# Patient Record
Sex: Male | Born: 1975 | Race: White | Hispanic: No | Marital: Married | Smoking: Never smoker
Health system: Southern US, Community
[De-identification: ages and names within clinical notes are randomized; demographics above are authoritative.]

## PROBLEM LIST (undated history)

## (undated) HISTORY — PX: NO PAST SURGERIES: SHX2092

---

## 2018-08-10 ENCOUNTER — Other Ambulatory Visit: Payer: Self-pay

## 2018-08-10 ENCOUNTER — Ambulatory Visit
Admission: EM | Admit: 2018-08-10 | Discharge: 2018-08-10 | Disposition: A | Discharge status: Home / Self Care | Payer: Self-pay | Attending: Emergency Medicine | Admitting: Emergency Medicine

## 2018-08-10 ENCOUNTER — Ambulatory Visit (INDEPENDENT_AMBULATORY_CARE_PROVIDER_SITE_OTHER): Payer: Self-pay

## 2018-08-10 DIAGNOSIS — M79671 Pain in right foot: Secondary | ICD-10-CM

## 2018-08-10 DIAGNOSIS — S92214A Nondisplaced fracture of cuboid bone of right foot, initial encounter for closed fracture: Secondary | ICD-10-CM

## 2018-08-10 DIAGNOSIS — M25571 Pain in right ankle and joints of right foot: Secondary | ICD-10-CM

## 2018-08-10 MED ORDER — TRAMADOL HCL 50 MG PO TABS: 50.0000 mg | ORAL_TABLET | Freq: Three times a day (TID) | ORAL | 0 refills | Status: AC | PRN

## 2018-08-10 NOTE — Discharge Instructions (Signed)
Take medication as prescribed.  Over-the-counter ibuprofen as needed.  Rest. Drink plenty of fluids.  Keep in splint and use crutches.  Ice and elevate.  Follow-up with podiatry at the beginning of this coming week.  See above to call tomorrow to schedule.  Follow up with your primary care physician this week as needed. Return to Urgent care for new or worsening concerns.

## 2018-08-10 NOTE — ED Provider Notes (Addendum)
[facility] ____________________________________________  Time seen: Approximately 6:18 PM  I have reviewed the triage vital signs and the nursing notes.   HISTORY  Chief Complaint Fall and Ankle Pain   HPI Richard Cowan is a 43 y.o. male presenting for evaluation of right foot and right ankle pain after injury that occurred around 1130 this morning.  Patient states when he got home from work he stepped into the shower, and states it was slippery causing him to roll his ankle and fall.  Denies any other injuries from the fall.  Denies head injury or loss of consciousness.  States pain is all to the right foot and ankle.  Has been able to apply weight but painful in doing so.  Did apply ice earlier.  Denies other alleviating measures attempted.  Pain currently moderate.  Denies previous injuries to right foot or ankle. Reports fracture to right tibia as young child. Denies recent cough, congestion, chest pain, shortness of breath, sore throat or fevers.  Denies other complaints.   History reviewed. No pertinent past medical history.  There are no active problems to display for this patient.   Past Surgical History:  Procedure Laterality Date  . NO PAST SURGERIES       No current facility-administered medications for this encounter.   Current Outpatient Medications:  .  traMADol (ULTRAM) 50 MG tablet, Take 1 tablet (50 mg total) by mouth every 8 (eight) hours as needed for moderate pain or severe pain., Disp: 15 tablet, Rfl: 0  Allergies Patient has no known allergies.  History reviewed. No pertinent family history.  Social History Social History   Tobacco Use  . Smoking status: Never Smoker  . Smokeless tobacco: Never Used  Substance Use Topics  . Alcohol use: Yes    Comment: occasionally  . Drug use: Not Currently    Review of Systems Constitutional: No fever Cardiovascular: Denies chest pain. Respiratory: Denies shortness of breath.  Gastrointestinal: No abdominal pain.   Musculoskeletal: Positive right foot and right ankle pain.  Negative for back pain. Skin: Negative for rash. Neurological: Negative for headaches, focal weakness or numbness.   ____________________________________________   PHYSICAL EXAM:  VITAL SIGNS: ED Triage Vitals  Enc Vitals Group     BP 08/10/18 1752 118/80     Pulse Rate 08/10/18 1752 86     Resp 08/10/18 1752 18     Temp 08/10/18 1752 97.8 F (36.6 C)     Temp Source 08/10/18 1752 Oral     SpO2 08/10/18 1752 100 %     Weight 08/10/18 1751 280 lb (127 kg)     Height 08/10/18 1751 6' (1.829 m)     Head Circumference --      Peak Flow --      Pain Score 08/10/18 1751 8     Pain Loc --      Pain Edu? --      Excl. in [place]? --     Constitutional: Alert and oriented. Well appearing and in no acute distress. ENT      Head: Normocephalic and atraumatic. Cardiovascular: Normal rate, regular rhythm. Grossly normal heart sounds.  Good peripheral circulation. Respiratory: Normal respiratory effort without tachypnea nor retractions. Breath sounds are clear and equal bilaterally. No wheezes, rales, rhonchi. Musculoskeletal: Ambulatory with antalgic gait.  Bilateral pedal pulses equal and easily palpated.   Except: Right lateral malleolus and mid to lateral right foot moderate tenderness to direct palpation with mild to moderate localized edema and  ecchymosis, normal distal sensation and capillary refill, medial foot and ankle nontender, good dorsiflexion and plantar flexion, limited ankle rotation, right lower extremity otherwise nontender.  Right tibia nontender. Neurologic:  Normal speech and language.  Skin:  Skin is warm, dry and intact. No rash noted. Psychiatric: Mood and affect are normal. Speech and behavior are normal. Patient exhibits appropriate insight and judgment   ___________________________________________   LABS (all labs ordered are listed, but only abnormal results are  displayed)  Labs Reviewed - No data to display ____________________________________________  RADIOLOGY  Dg Ankle Complete Right  Result Date: 08/10/2018 CLINICAL DATA:  Larey SeatFell getting in tub today and injured right foot and ankle. Most pain swelling/bruising over top of foot near cuboid area and lat malleolus EXAM: RIGHT ANKLE - COMPLETE 3+ VIEW COMPARISON:  None. FINDINGS: There is moderate soft tissue swelling at the ankle come particularly along the LATERAL aspect. There is no acute fracture or subluxation. Small Achilles spur is present. Deformity of the mid to distal RIGHT tibia favors remote fracture. IMPRESSION: 1. Soft tissue swelling. 2. No evidence for acute osseous abnormality. Electronically Signed   By: Norva PavlovElizabeth  Brown M.D.   On: 08/10/2018 18:19   Dg Foot Complete Right  Result Date: 08/10/2018 CLINICAL DATA:  Larey SeatFell getting in tub today and injured right foot and ankle. Most pain swelling/bruising over top of foot near cuboid area and lat malleolus EXAM: RIGHT FOOT COMPLETE - 3+ VIEW COMPARISON:  None. FINDINGS: There is soft tissue swelling along the LATERAL aspect of the midfoot in the dorsum of the forefoot. There is no acute fracture or subluxation. Small Achilles spur is present. IMPRESSION: 1. Soft tissue swelling. 2. No evidence for acute osseous abnormality. Electronically Signed   By: Norva PavlovElizabeth  Brown M.D.   On: 08/10/2018 18:18   ____________________________________________   PROCEDURES Procedures     INITIAL IMPRESSION / ASSESSMENT AND PLAN / ED COURSE  Pertinent labs & imaging results that were available during my care of the patient were reviewed by me and considered in my medical decision making (see chart for details).  Room patient.  No acute distress.  Right foot and right ankle pain post mechanical injury that occurred this morning.  Denies other complaints.  Right foot and right ankle x-rays as above.  Right ankle x-ray, soft tissue swelling, no evidence for  acute osseous abnormality per radiologist.  Right foot x-ray per radiologist, soft tissue swelling, no evidence for acute osseous abnormality.  Called and discussed with radiologist for concern of acute cuboid fracture, radiologist felt possible chronic.  Discussed with patient, patient denies any previous injury to the same area of the foot and point clinically tender, suspect acute cuboid avulsion.  Posterior OCL splint applied and crutches given.  Over-the-counter ibuprofen and PRN tramadol.  Follow-up with podiatry at the beginning of this coming week.  Encourage frequent elevation and ice, keep in splint and use crutches.  Supportive care.  Work note given.Discussed indication, risks and benefits of medications with patient.  Discussed follow up with Primary care physician this week. Discussed follow up and return parameters including no resolution or any worsening concerns. Patient verbalized understanding and agreed to plan.    [place] controlled substance database reviewed, no recent controlled substance documented.  ____________________________________________   FINAL CLINICAL IMPRESSION(S) / ED DIAGNOSES  Final diagnoses:  Right foot pain  Acute right ankle pain  Closed nondisplaced fracture of cuboid of right foot, initial encounter     ED Discharge Orders  Ordered    traMADol (ULTRAM) 50 MG tablet  Every 8 hours PRN     08/10/18 1843           Note: This dictation was prepared with Dragon dictation along with smaller phrase technology. Any transcriptional errors that result from this process are unintentional.           Renford DillsMiller, Alrick Cubbage, NP 08/10/18 1851

## 2018-08-10 NOTE — ED Triage Notes (Signed)
Patient complains of a fall that occurred this morning around 1130am. Patient states that his wife had cleaned the tub with a solution and he fell in the tub. Patient reports that ankle is most painful but has swelling and bruising to feet.

## 2020-03-14 IMAGING — CR RIGHT FOOT COMPLETE - 3+ VIEW
3 series · 3 of 3 positions shown · non-contrast
Comparison: None.

CLINICAL DATA: Fell getting in tub today and injured right foot and
ankle. Most pain swelling/bruising over top of foot near cuboid area
and lat malleolus

EXAM:
RIGHT FOOT COMPLETE - 3+ VIEW

[foot ap]
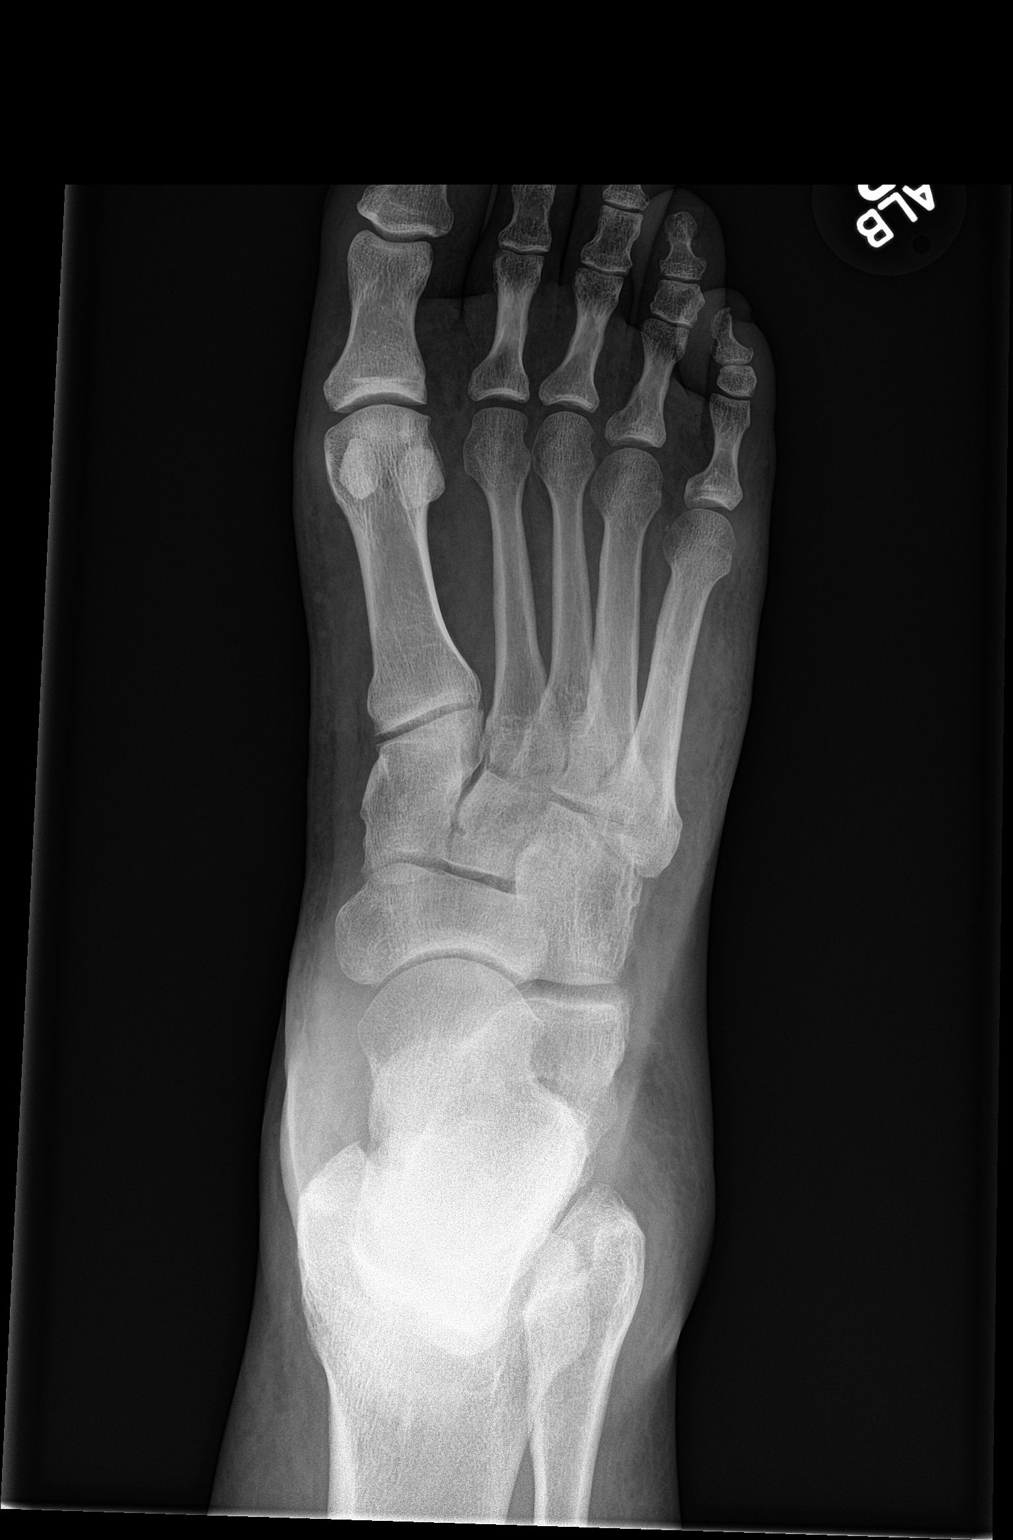

[foot obl]
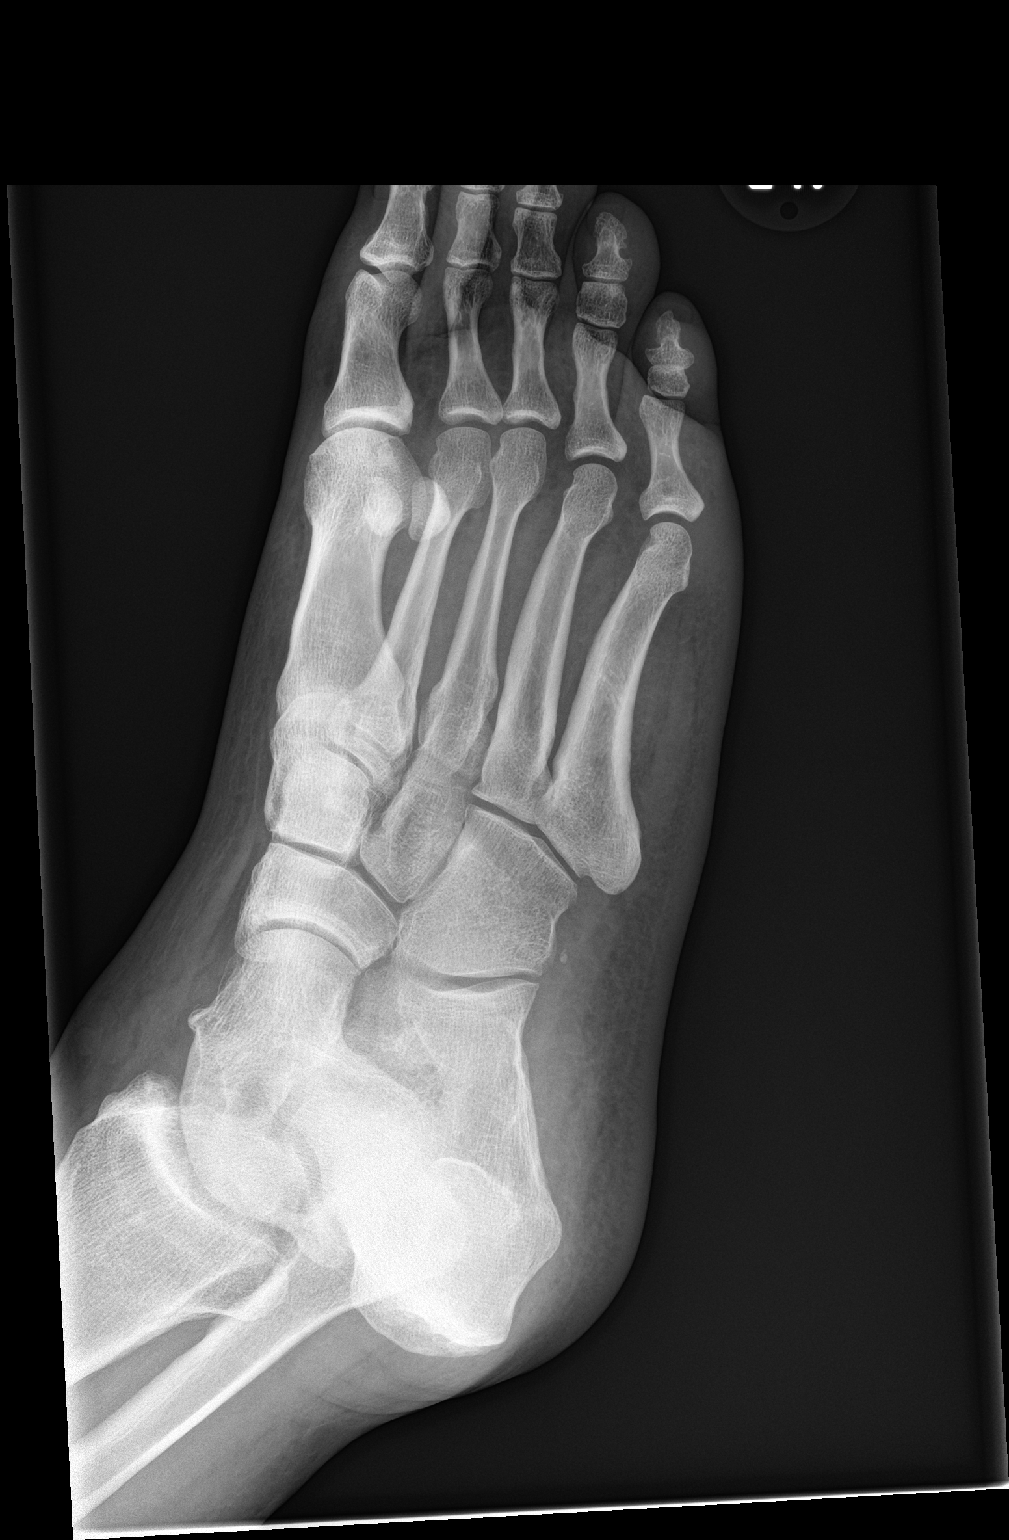

[foot lat]
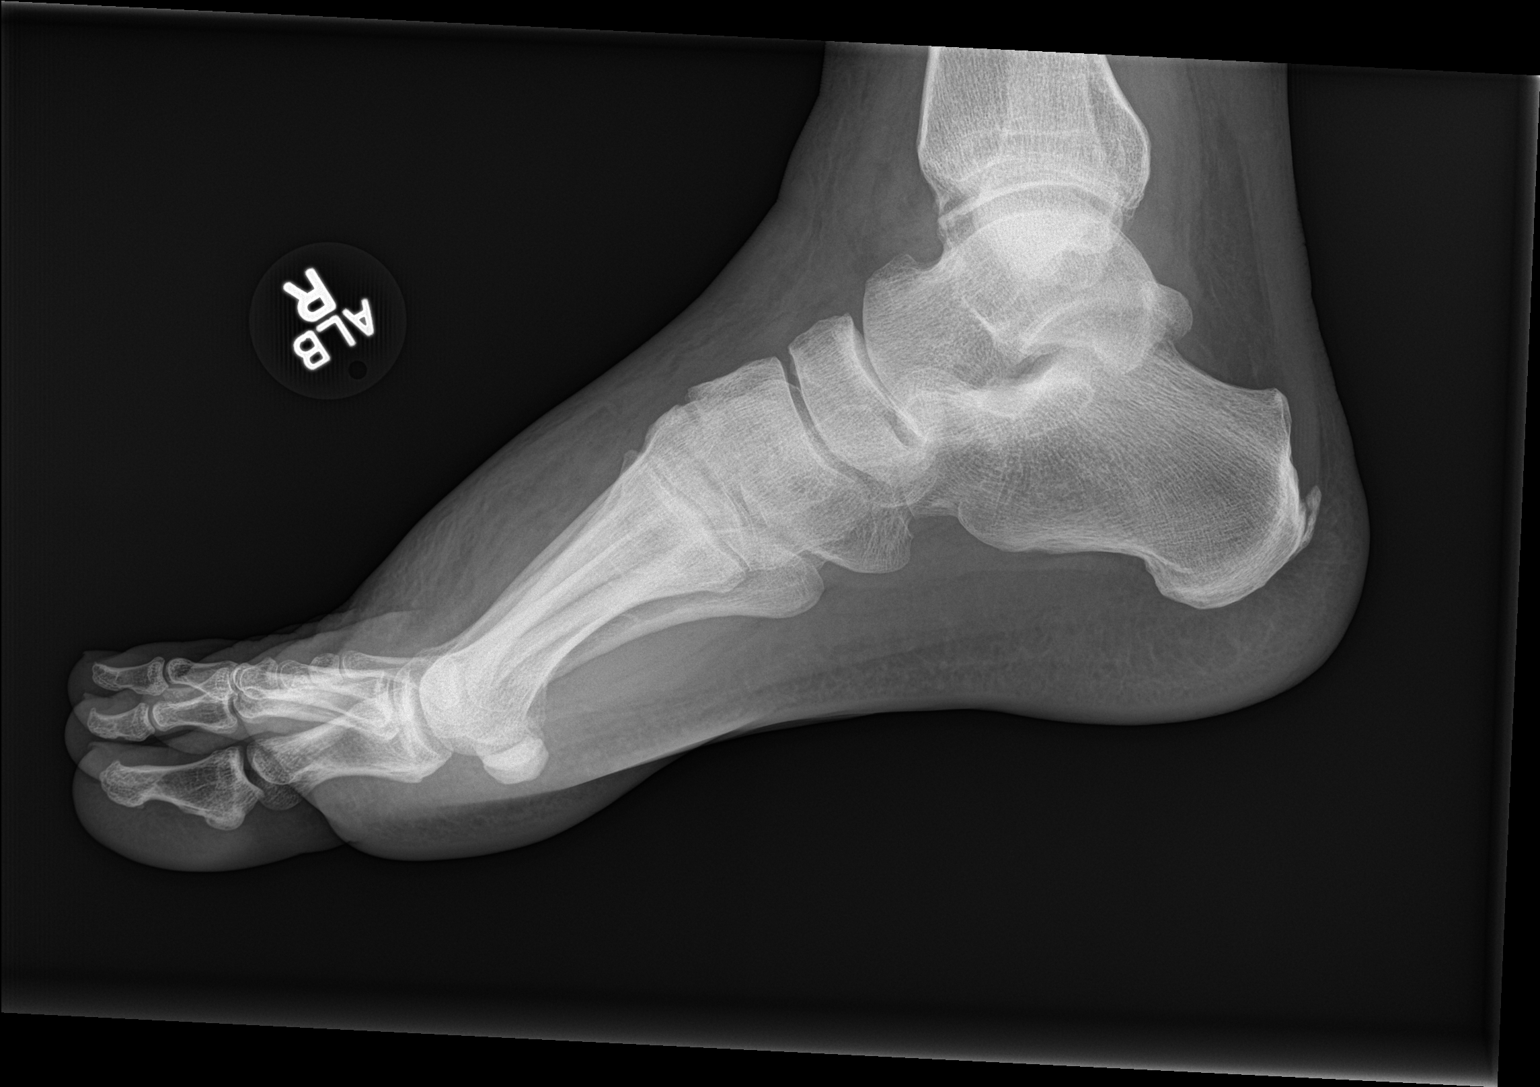

[3 of 3 positions shown; findings below may reference images not displayed]

FINDINGS: There is soft tissue swelling along the LATERAL aspect of the
midfoot in the dorsum of the forefoot. There is no acute fracture or
subluxation. Small Achilles spur is present.
IMPRESSION: 1. Soft tissue swelling.
2. No evidence for acute osseous abnormality.
